# Patient Record
Sex: Male | Born: 1955 | Race: Black or African American | Hispanic: No | Marital: Single
Health system: Southern US, Community
[De-identification: ages and names within clinical notes are randomized; demographics above are authoritative.]

---

## 2008-05-06 ENCOUNTER — Emergency Department: Payer: Self-pay | Admitting: Emergency Medicine

## 2008-05-16 ENCOUNTER — Emergency Department: Payer: Self-pay | Admitting: Emergency Medicine

## 2010-03-30 ENCOUNTER — Emergency Department: Payer: Self-pay | Admitting: Emergency Medicine

## 2017-08-19 ENCOUNTER — Emergency Department
Admission: EM | Admit: 2017-08-19 | Discharge: 2017-08-19 | Disposition: A | Discharge status: Home / Self Care | Payer: Self-pay | Attending: Emergency Medicine | Admitting: Emergency Medicine

## 2017-08-19 ENCOUNTER — Emergency Department: Payer: Self-pay

## 2017-08-19 DIAGNOSIS — M5431 Sciatica, right side: Secondary | ICD-10-CM | POA: Insufficient documentation

## 2017-08-19 MED ORDER — CYCLOBENZAPRINE HCL 10 MG PO TABS: 10.0000 mg | ORAL_TABLET | Freq: Three times a day (TID) | ORAL | 0 refills | Status: AC | PRN

## 2017-08-19 MED ORDER — ONDANSETRON HCL 4 MG/2ML IJ SOLN
INTRAMUSCULAR | Status: AC
  Administered 2017-08-19: 4 mg via INTRAVENOUS
  Filled 2017-08-19: qty 2

## 2017-08-19 MED ORDER — MORPHINE SULFATE (PF) 4 MG/ML IV SOLN
4.0000 mg | Freq: Once | INTRAVENOUS | Status: AC
  Administered 2017-08-19: 4 mg via INTRAVENOUS

## 2017-08-19 MED ORDER — ONDANSETRON HCL 4 MG/2ML IJ SOLN
4.0000 mg | Freq: Once | INTRAMUSCULAR | Status: AC
  Administered 2017-08-19: 4 mg via INTRAVENOUS

## 2017-08-19 MED ORDER — PREDNISONE 20 MG PO TABS: 60.0000 mg | ORAL_TABLET | Freq: Every day | ORAL | 0 refills | Status: AC

## 2017-08-19 MED ORDER — MORPHINE SULFATE (PF) 4 MG/ML IV SOLN
INTRAVENOUS | Status: AC
  Administered 2017-08-19: 4 mg via INTRAVENOUS
  Filled 2017-08-19: qty 1

## 2017-08-19 NOTE — ED Provider Notes (Addendum)
[facility] Emergency Department Provider Note    First MD Initiated Contact with Patient 08/19/17 [phone]     (approximate)  I have reviewed the triage vital signs and the nursing notes.   HISTORY  Chief Complaint Leg Pain   HPI Marcus West is a 61 y.o. male presents to the emergency department with acute onset of low back pain with radiation down the right leg while picking up a heavy object yesterday. Patient states current pain score is 10 out of 10. Patient denies any urinary incontinence or retention. Patient denies any bowel incontinence or constipation.   Past medical history Previous low back pain There are no active problems to display for this patient.   Past surgical history None  Prior to Admission medications   Not on File    Allergies No known drug allergies No family history on file.  Social History Social History  Substance Use Topics  . Smoking status: Not on file  . Smokeless tobacco: Not on file  . Alcohol use Not on file    Review of Systems Constitutional: No fever/chills Eyes: No visual changes. ENT: No sore throat. Cardiovascular: Denies chest pain. Respiratory: Denies shortness of breath. Gastrointestinal: No abdominal pain.  No nausea, no vomiting.  No diarrhea.  No constipation. Genitourinary: Negative for dysuria. Musculoskeletal: Negative for neck pain.  Positive for back pain. Integumentary: Negative for rash. Neurological: Negative for headaches, focal weakness or numbness.   ____________________________________________   PHYSICAL EXAM:  VITAL SIGNS: ED Triage Vitals  Enc Vitals Group     BP 08/19/17 0225 (!) 166/141     Pulse Rate 08/19/17 0225 78     Resp 08/19/17 0225 18     Temp 08/19/17 0225 97.7 F (36.5 C)     Temp Source 08/19/17 0225 Oral     SpO2 08/19/17 0225 98 %     Weight 08/19/17 0224 68 kg (150 lb)     Height 08/19/17 0224 1.651 m (5\' 5" )     Head Circumference --    Peak Flow --      Pain Score 08/19/17 0223 10     Pain Loc --      Pain Edu? --      Excl. in GC? --     Constitutional: Alert and oriented. Apparent discomfort. Eyes: Conjunctivae are normal.  Head: Atraumatic. Mouth/Throat: Mucous membranes are moist.  Oropharynx non-erythematous. Neck: No stridor.   Cardiovascular: Normal rate, regular rhythm. Good peripheral circulation. Grossly normal heart sounds. Respiratory: Normal respiratory effort.  No retractions. Lungs CTAB. Gastrointestinal: Soft and nontender. No distention.   Musculoskeletal: No lower extremity tenderness nor edema. No gross deformities of extremities. Neurologic:  Normal speech and language. No gross focal neurologic deficits are appreciated.  Skin:  Skin is warm, dry and intact. No rash noted. Psychiatric: Mood and affect are normal. Speech and behavior are normal.    RADIOLOGY I, [place] [facility] Laser, personally viewed and evaluated these images (plain radiographs) as part of my medical decision making, as well as reviewing the written report by the radiologist.  Mr Lumbar Spine Wo Contrast  Result Date: 08/19/2017 CLINICAL DATA:  Back pain radiating to the left leg EXAM: MRI LUMBAR SPINE WITHOUT CONTRAST TECHNIQUE: Multiplanar, multisequence MR imaging of the lumbar spine was performed. No intravenous contrast was administered. COMPARISON:  None. FINDINGS: Segmentation:  Standard. Alignment:  Physiologic. Vertebrae: Modic type 1 discogenic endplate signal changes at L4-L5. No compression fracture or focal marrow signal  abnormality. Conus medullaris: Extends to the L1 level and appears normal. Paraspinal and other soft tissues: Negative. Disc levels: The T11-L3 disc spaces are normal. L3-L4: Small disc bulge with annular fissure centrally. Mild facet hypertrophy. No spinal canal stenosis. Mild-to-moderate left neural foraminal stenosis. L4-L5: Disc space narrowing with mild bulge and moderate right, mild left facet  hypertrophy. Small endplate osteophytes. No spinal canal stenosis. Severe right and mild-to-moderate left neural foraminal stenosis. L5-S1: Small central disc protrusion without spinal canal stenosis. Mild bilateral foraminal narrowing. IMPRESSION: 1. Severe right L4-L5 neural foraminal stenosis due to combination of disc bulge and severe right facet hypertrophy. 2. Mild-to-moderate left neural foraminal stenosis at L3-L4 due to disc bulge and endplate osteophyte. Electronically Signed   By: Deatra Robinson M.D.   On: 08/19/2017 04:00     Procedures   ____________________________________________   INITIAL IMPRESSION / ASSESSMENT AND PLAN / ED COURSE  As part of my medical decision making, I reviewed the following data within the electronic MEDICAL RECORD [id number] year old male presenting with above stated history of low back pain with radiation down her posterior right leg. Concern for possible sciatica. Considered possibly of cauda equina syndrome however symptoms not consistent with beforementioned. Patient will be referred to Dr. 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     ____________________________________________  FINAL CLINICAL IMPRESSION(S) / ED DIAGNOSES  Final diagnoses:  Sciatica of right side     MEDICATIONS GIVEN DURING THIS VISIT:  Medications  morphine 4 MG/ML injection 4 mg (4 mg Intravenous Given 08/19/17 0238)  ondansetron (ZOFRAN) injection 4 mg (4 mg Intravenous Given 08/19/17 0238)     NEW OUTPATIENT MEDICATIONS STARTED DURING THIS VISIT:  New Prescriptions   No medications on file    Modified Medications   No medications on file    Discontinued Medications   No medications on file     Note:  This document was prepared using Dragon voice recognition software and may include unintentional dictation errors.    Darci Current, MD 08/19/17 0532    Darci Current, MD 08/19/17  [phone]

## 2017-08-19 NOTE — ED Notes (Signed)

## 2017-08-19 NOTE — ED Notes (Addendum)
Pt states pain radiating from back, to left groin to leg. ROM intact

## 2017-08-19 NOTE — ED Notes (Signed)
Patient transported to MRI with Lorin PicketScott, EDT

## 2017-08-19 NOTE — ED Triage Notes (Signed)
Pt in with co left lower back pain that radiates to left leg. Pt is very restless and tearful in triage, no hx of back pain.

## 2018-03-24 IMAGING — MR MR LUMBAR SPINE W/O CM
5 series · 43 of 48 positions shown · non-contrast
Comparison: None.

CLINICAL DATA: Back pain radiating to the left leg

EXAM:
MRI LUMBAR SPINE WITHOUT CONTRAST
TECHNIQUE: Multiplanar, multisequence MR imaging of the lumbar spine was
performed. No intravenous contrast was administered.

[Series 2: T2 · sagittal · 4.0mm · 1.02mm/px · 7 of 15 slices shown (1 of 2)]
[im 1/15]
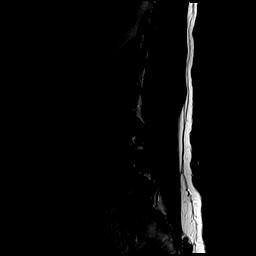
[im 3/15]
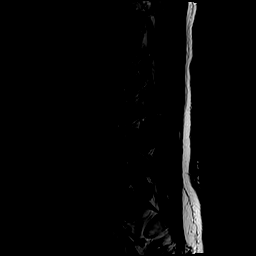
[im 5/15]
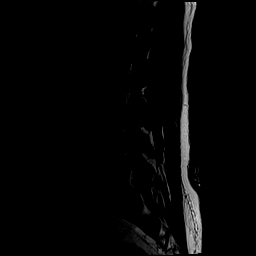
[im 8/15]
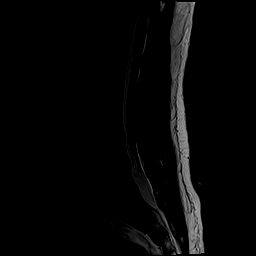
[im 10/15]
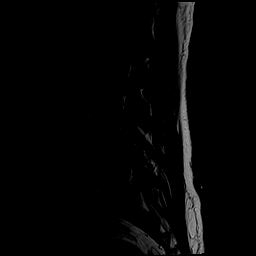
[im 12/15]
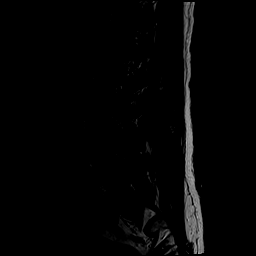
[im 15/15]
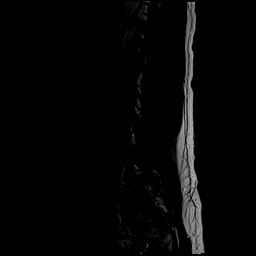

[Series 3: T1 · sagittal · 4.0mm · 1.02mm/px · 7 of 15 slices shown (1 of 2)]
[im 1/15]
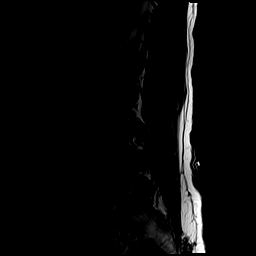
[im 3/15]
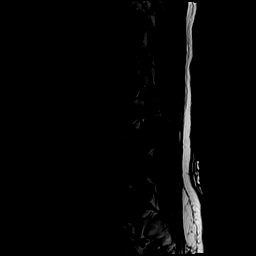
[im 5/15]
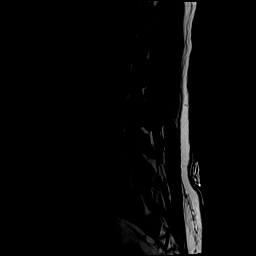
[im 8/15]
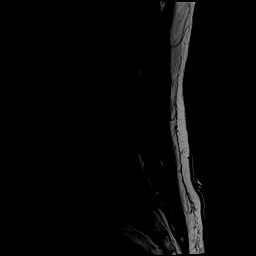
[im 10/15]
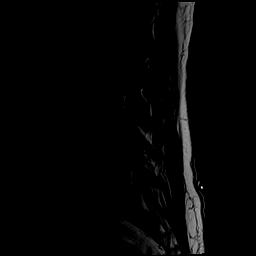
[im 12/15]
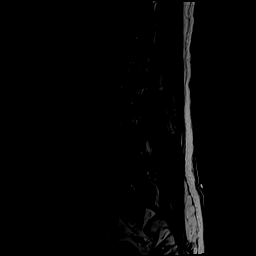
[im 15/15]
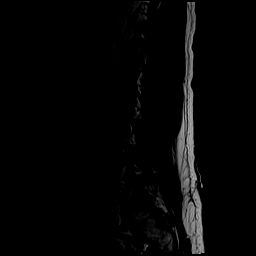

[Series 4: STIR · sagittal · 4.0mm · 1.02mm/px · 6 of 15 slices shown]
[im 1/15]
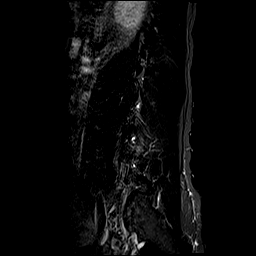
[im 3/15]
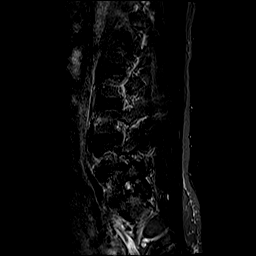
[im 6/15]
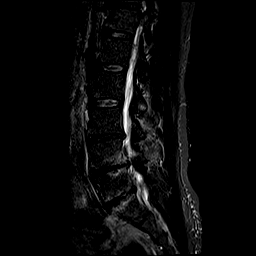
[im 9/15]
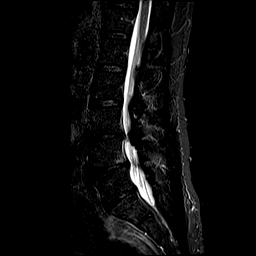
[im 12/15]
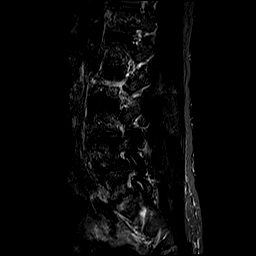
[im 15/15]
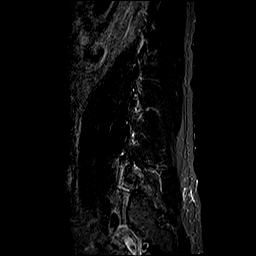

[Series 5: T2 · axial · 4.0mm · 0.78mm/px · z∈[+69,+251]mm · 14 of 34 slices shown (2 of 2)]
[im 1/34]
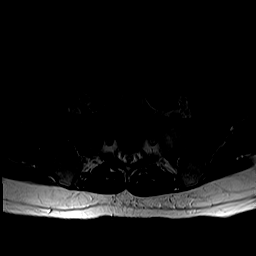
[im 3/34]
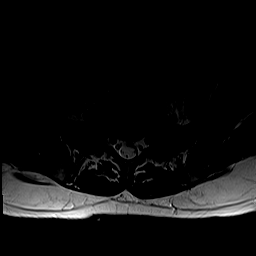
[im 6/34]
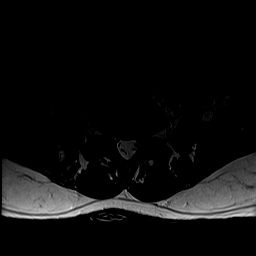
[im 8/34]
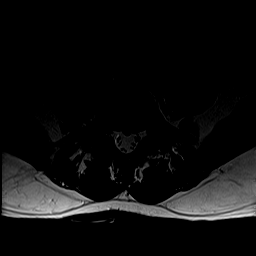
[im 11/34]
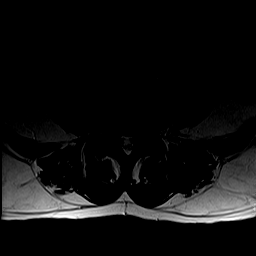
[im 13/34]
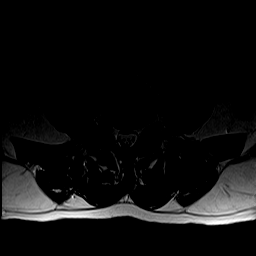
[im 16/34]
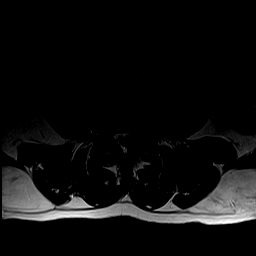
[im 18/34]
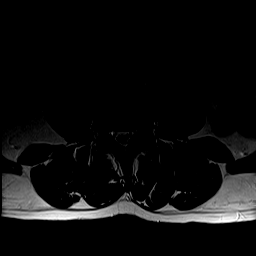
[im 21/34]
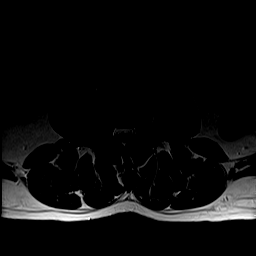
[im 23/34]
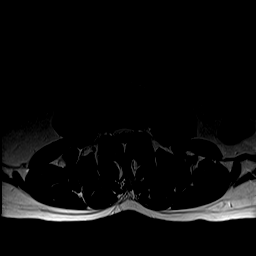
[im 26/34]
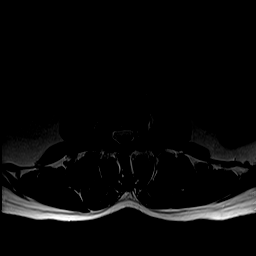
[im 28/34]
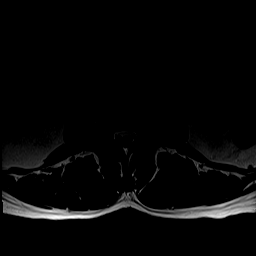
[im 31/34]
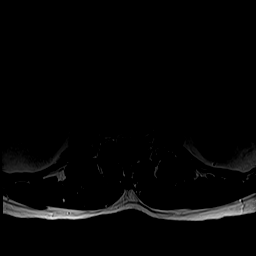
[im 34/34]
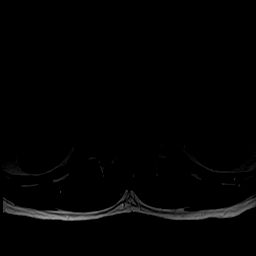

[Series 6: T1 · axial · 4.0mm · 0.39mm/px · z∈[+69,+251]mm · 9 of 34 slices shown (2 of 2)]
[im 1/34]
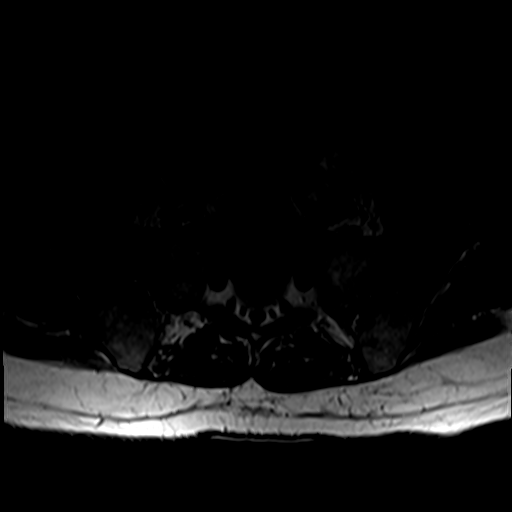
[im 3/34]
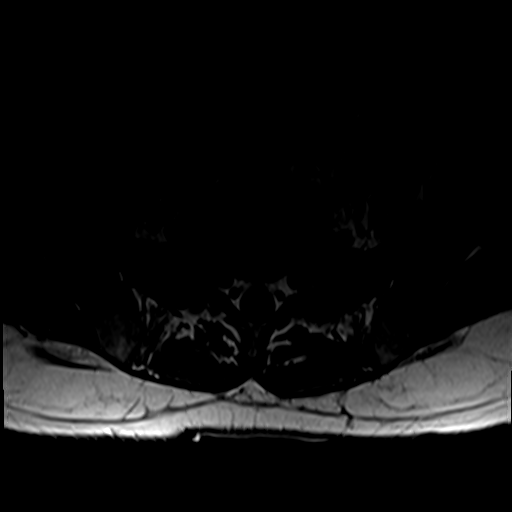
[im 6/34]
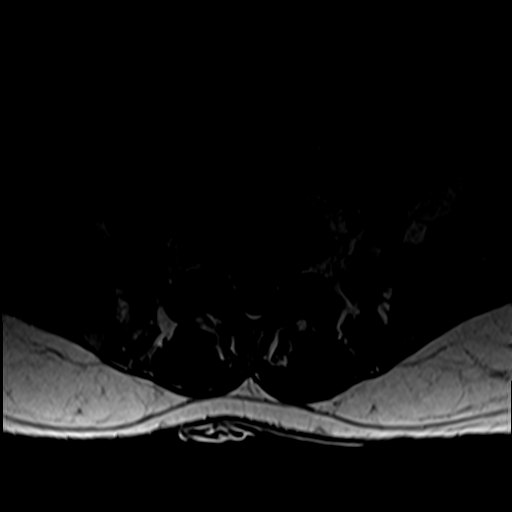
[im 11/34]
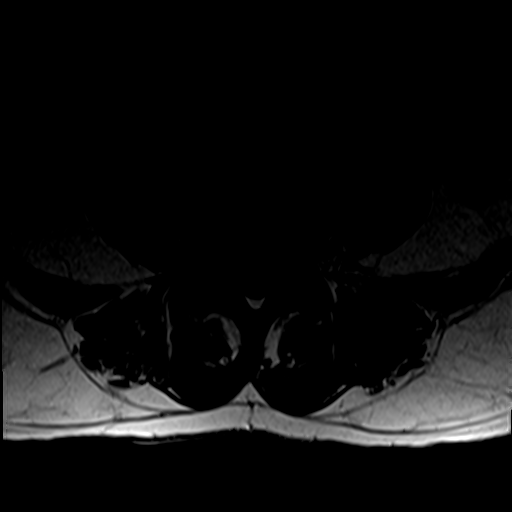
[im 16/34]
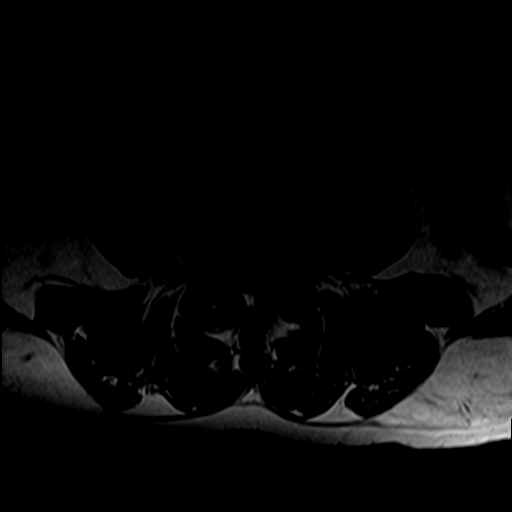
[im 18/34]
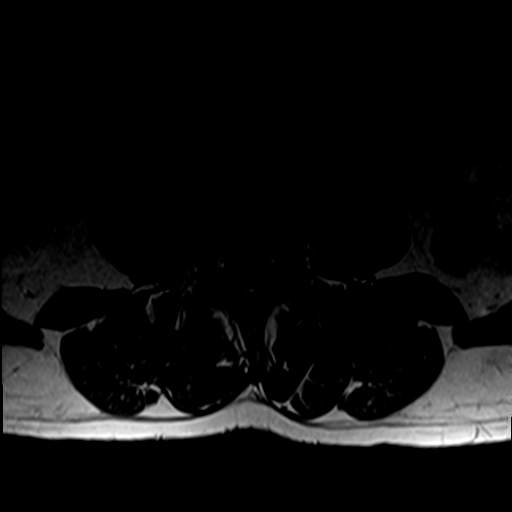
[im 23/34]
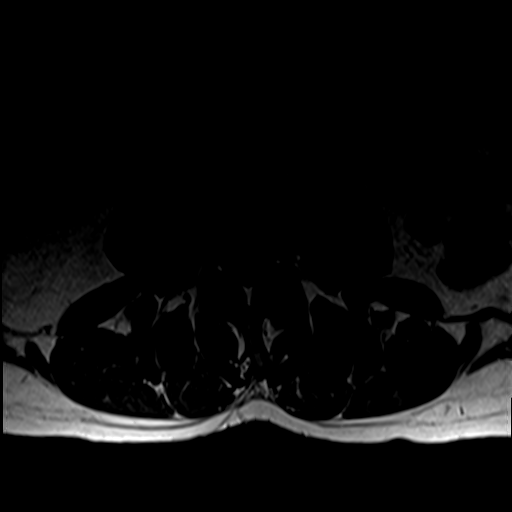
[im 28/34]
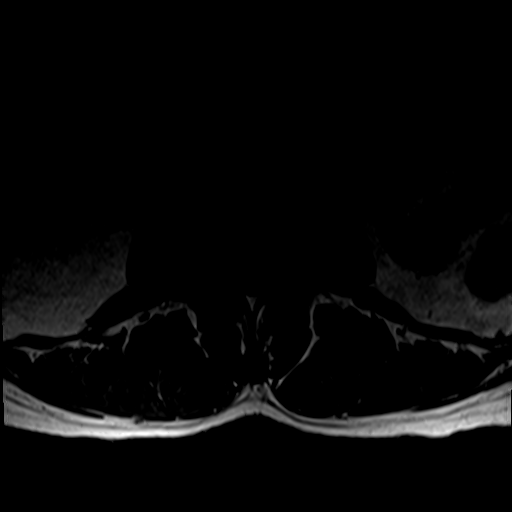
[im 34/34]
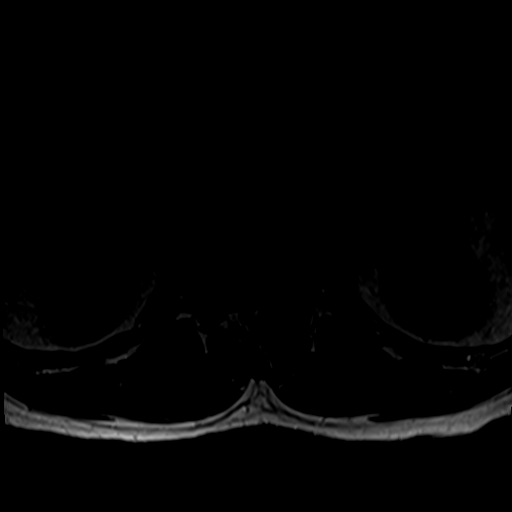

[43 of 48 positions shown; findings below may reference images not displayed]

FINDINGS: Segmentation:  Standard.

Alignment:  Physiologic.

Vertebrae: Modic type 1 discogenic endplate signal changes at L4-L5.
No compression fracture or focal marrow signal abnormality.

Conus medullaris: Extends to the L1 level and appears normal.

Paraspinal and other soft tissues: Negative.

Disc levels:

The T11-L3 disc spaces are normal.

L3-L4: Small disc bulge with annular fissure centrally. Mild facet
hypertrophy. No spinal canal stenosis. Mild-to-moderate left neural
foraminal stenosis.

L4-L5: Disc space narrowing with mild bulge and moderate right, mild
left facet hypertrophy. Small endplate osteophytes. No spinal canal
stenosis. Severe right and mild-to-moderate left neural foraminal
stenosis.

L5-S1: Small central disc protrusion without spinal canal stenosis.
Mild bilateral foraminal narrowing.
IMPRESSION: 1. Severe right L4-L5 neural foraminal stenosis due to combination
of disc bulge and severe right facet hypertrophy.
2. Mild-to-moderate left neural foraminal stenosis at L3-L4 due to
disc bulge and endplate osteophyte.

## 2020-01-08 ENCOUNTER — Other Ambulatory Visit: Payer: Self-pay

## 2020-01-08 ENCOUNTER — Ambulatory Visit: Payer: Self-pay | Attending: Internal Medicine

## 2020-01-08 DIAGNOSIS — Z23 Encounter for immunization: Secondary | ICD-10-CM

## 2020-01-08 NOTE — Progress Notes (Signed)
   Covid-19 Vaccination Clinic  Name:  Marcus West    MRN: [id number] DOB: [birth year]  01/08/2020  [name] was observed post Covid-19 immunization for 15 minutes without incident. He was provided with Vaccine Information Sheet and instruction to access the V-Safe system.   [name] was instructed to call 911 with any severe reactions post vaccine: Marland Kitchen Difficulty breathing  . Swelling of face and throat  . A fast heartbeat  . A bad rash all over body  . Dizziness and weakness   Immunizations Administered    Name Date Dose VIS Date Route   Pfizer COVID-19 Vaccine 01/08/2020 11:13 AM 0.3 mL 09/29/2019 Intramuscular   Manufacturer: [employer], [employer]   Lot: [id number]   NDC: [address]-7

## 2020-02-06 ENCOUNTER — Ambulatory Visit: Payer: Self-pay | Attending: Internal Medicine

## 2020-02-06 DIAGNOSIS — Z23 Encounter for immunization: Secondary | ICD-10-CM

## 2020-02-06 NOTE — Progress Notes (Signed)
   Covid-19 Vaccination Clinic  Name:  Marcus West    MRN: [id number] DOB: [birth year]  02/06/2020  [name] was observed post Covid-19 immunization for 15 minutes without incident. He was provided with Vaccine Information Sheet and instruction to access the V-Safe system.   [name] was instructed to call 911 with any severe reactions post vaccine: Marland Kitchen Difficulty breathing  . Swelling of face and throat  . A fast heartbeat  . A bad rash all over body  . Dizziness and weakness   Immunizations Administered    Name Date Dose VIS Date Route   Pfizer COVID-19 Vaccine 02/06/2020  9:53 AM 0.3 mL 12/13/2018 Intramuscular   Manufacturer: [employer], [employer]   Lot: [id number]   NDC: [address]-1

## 2023-08-19 ENCOUNTER — Encounter (INDEPENDENT_AMBULATORY_CARE_PROVIDER_SITE_OTHER): Payer: Self-pay | Admitting: Ophthalmology

## 2023-08-23 ENCOUNTER — Encounter (INDEPENDENT_AMBULATORY_CARE_PROVIDER_SITE_OTHER): Payer: Medicare Other | Admitting: Ophthalmology

## 2023-08-23 DIAGNOSIS — I10 Essential (primary) hypertension: Secondary | ICD-10-CM | POA: Diagnosis not present

## 2023-08-23 DIAGNOSIS — H43813 Vitreous degeneration, bilateral: Secondary | ICD-10-CM

## 2023-08-23 DIAGNOSIS — H35033 Hypertensive retinopathy, bilateral: Secondary | ICD-10-CM

## 2023-08-23 DIAGNOSIS — H353132 Nonexudative age-related macular degeneration, bilateral, intermediate dry stage: Secondary | ICD-10-CM

## 2023-09-13 ENCOUNTER — Encounter (INDEPENDENT_AMBULATORY_CARE_PROVIDER_SITE_OTHER): Payer: Medicare Other | Admitting: Ophthalmology

## 2023-09-13 DIAGNOSIS — H353132 Nonexudative age-related macular degeneration, bilateral, intermediate dry stage: Secondary | ICD-10-CM

## 2023-09-13 DIAGNOSIS — I10 Essential (primary) hypertension: Secondary | ICD-10-CM

## 2023-09-13 DIAGNOSIS — H43813 Vitreous degeneration, bilateral: Secondary | ICD-10-CM

## 2023-09-13 DIAGNOSIS — H35033 Hypertensive retinopathy, bilateral: Secondary | ICD-10-CM

## 2023-12-07 ENCOUNTER — Encounter (INDEPENDENT_AMBULATORY_CARE_PROVIDER_SITE_OTHER): Payer: Medicare Other | Admitting: Ophthalmology

## 2023-12-07 DIAGNOSIS — H35033 Hypertensive retinopathy, bilateral: Secondary | ICD-10-CM | POA: Diagnosis not present

## 2023-12-07 DIAGNOSIS — I10 Essential (primary) hypertension: Secondary | ICD-10-CM

## 2023-12-07 DIAGNOSIS — H353132 Nonexudative age-related macular degeneration, bilateral, intermediate dry stage: Secondary | ICD-10-CM | POA: Diagnosis not present

## 2023-12-07 DIAGNOSIS — H43813 Vitreous degeneration, bilateral: Secondary | ICD-10-CM

## 2023-12-13 ENCOUNTER — Encounter (INDEPENDENT_AMBULATORY_CARE_PROVIDER_SITE_OTHER): Payer: Medicare Other | Admitting: Ophthalmology

## 2024-06-05 ENCOUNTER — Encounter (INDEPENDENT_AMBULATORY_CARE_PROVIDER_SITE_OTHER): Payer: Medicare Other | Admitting: Ophthalmology

## 2024-06-05 DIAGNOSIS — H353132 Nonexudative age-related macular degeneration, bilateral, intermediate dry stage: Secondary | ICD-10-CM | POA: Diagnosis not present

## 2024-06-05 DIAGNOSIS — I10 Essential (primary) hypertension: Secondary | ICD-10-CM | POA: Diagnosis not present

## 2024-06-05 DIAGNOSIS — H43813 Vitreous degeneration, bilateral: Secondary | ICD-10-CM

## 2024-06-05 DIAGNOSIS — H2513 Age-related nuclear cataract, bilateral: Secondary | ICD-10-CM

## 2024-06-05 DIAGNOSIS — H35033 Hypertensive retinopathy, bilateral: Secondary | ICD-10-CM | POA: Diagnosis not present

## 2025-06-05 ENCOUNTER — Encounter (INDEPENDENT_AMBULATORY_CARE_PROVIDER_SITE_OTHER): Admitting: Ophthalmology
